# Patient Record
Sex: Male | Born: 1990 | Race: Black or African American | Hispanic: No | Marital: Single | Smoking: Never smoker
Health system: Southern US, Community
[De-identification: ages and names within clinical notes are randomized; demographics above are authoritative.]

---

## 2015-10-08 ENCOUNTER — Emergency Department (HOSPITAL_COMMUNITY)
Admission: EM | Admit: 2015-10-08 | Discharge: 2015-10-08 | Disposition: A | Discharge status: Home / Self Care | Payer: Medicaid Other | Attending: Emergency Medicine | Admitting: Emergency Medicine

## 2015-10-08 ENCOUNTER — Encounter (HOSPITAL_COMMUNITY): Payer: Self-pay | Admitting: Emergency Medicine

## 2015-10-08 DIAGNOSIS — K029 Dental caries, unspecified: Secondary | ICD-10-CM | POA: Diagnosis not present

## 2015-10-08 DIAGNOSIS — K0889 Other specified disorders of teeth and supporting structures: Secondary | ICD-10-CM | POA: Diagnosis present

## 2015-10-08 MED ORDER — PENICILLIN V POTASSIUM 500 MG PO TABS: 500.0000 mg | ORAL_TABLET | Freq: Three times a day (TID) | ORAL | Status: AC

## 2015-10-08 MED ORDER — IBUPROFEN 800 MG PO TABS: 800.0000 mg | ORAL_TABLET | Freq: Three times a day (TID) | ORAL | Status: AC

## 2015-10-08 NOTE — ED Notes (Signed)
Patient states R lower dental pain x 2 days.   Denies decay or broken teeth.   Denies other symptoms.  Patient states has not been to a dentist.

## 2015-10-08 NOTE — Discharge Instructions (Signed)

## 2015-10-08 NOTE — ED Provider Notes (Signed)
CSN: [id number]     Arrival date & time 10/08/15  1217 History  By signing my name below, I, Freida Busman, attest that this documentation has been prepared under the direction and in the presence of non-physician practitioner, Fayrene Helper, PA-C. Electronically Signed: Freida Busman, Scribe. 10/08/2015. 12:38 PM.    Chief Complaint  Patient presents with  . Dental Pain   The history is provided by the patient. No language interpreter was used.    HPI Comments:  Brandon Hatfield is a 25 y.o. male who presents to the Emergency Department complaining of throbbing moderate right lower dental pain x 2 days. His pain is exacerbated when chewing. He denies fever. No alleviating factors noted. Pt does not currently have a dentist.    History reviewed. No pertinent past medical history. History reviewed. No pertinent past surgical history. No family history on file. Social History  Substance Use Topics  . Smoking status: Never Smoker   . Smokeless tobacco: None  . Alcohol Use: No    Review of Systems  Constitutional: Negative for fever.  HENT: Positive for dental problem.       Allergies  Review of patient's allergies indicates no known allergies.  Home Medications   Prior to Admission medications   Not on File   BP 137/88 mmHg  Pulse 62  Temp(Src) 98.4 F (36.9 C) (Oral)  Resp 16  SpO2 100% Physical Exam  Constitutional: He is oriented to person, place, and time. He appears well-developed and well-nourished. No distress.  HENT:  Head: Normocephalic and atraumatic.  Dental decay noted to tooth # 32 with tenderness; no abscess or gingival erythema   Eyes: Conjunctivae are normal.  Cardiovascular: Normal rate.   Pulmonary/Chest: Effort normal.  Abdominal: He exhibits no distension.  Lymphadenopathy:    He has cervical adenopathy.  Neurological: He is alert and oriented to person, place, and time.  Skin: Skin is warm and dry.  Psychiatric: He has a normal mood and  affect.  Nursing note and vitals reviewed.   ED Course  Procedures  DIAGNOSTIC STUDIES:  Oxygen Saturation is 100% on RA, normal by my interpretation.    COORDINATION OF CARE:  12:39 PM Will discharge with antibiotic and dental referral.  Discussed treatment plan with pt at bedside and pt agreed to plan.    MDM   Final diagnoses:  Pain due to dental caries    BP 137/88 mmHg  Pulse 62  Temp(Src) 98.4 F (36.9 C) (Oral)  Resp 16  SpO2 100%  Patient with dentalgia.  No abscess requiring immediate incision and drainage.  Exam not concerning for Ludwig's angina or pharyngeal abscess.  Will treat with PCN and ibuprofen. Pt instructed to follow-up with dentist.  Discussed return precautions. Pt safe for discharge.  I personally performed the services described in this documentation, which was scribed in my presence. The recorded information has been reviewed and is accurate.      Fayrene Helper, PA-C 10/10/15 7829  Vanetta Mulders, MD 10/10/15 2239

## 2016-03-26 ENCOUNTER — Emergency Department (HOSPITAL_COMMUNITY): Payer: Medicaid Other

## 2016-03-26 ENCOUNTER — Encounter (HOSPITAL_COMMUNITY): Payer: Self-pay | Admitting: *Deleted

## 2016-03-26 ENCOUNTER — Emergency Department (HOSPITAL_COMMUNITY)
Admission: EM | Admit: 2016-03-26 | Discharge: 2016-03-26 | Disposition: A | Discharge status: Home / Self Care | Payer: Medicaid Other | Attending: Emergency Medicine | Admitting: Emergency Medicine

## 2016-03-26 DIAGNOSIS — I456 Pre-excitation syndrome: Secondary | ICD-10-CM | POA: Diagnosis not present

## 2016-03-26 DIAGNOSIS — R079 Chest pain, unspecified: Secondary | ICD-10-CM

## 2016-03-26 DIAGNOSIS — R0789 Other chest pain: Secondary | ICD-10-CM | POA: Diagnosis not present

## 2016-03-26 LAB — CBC WITH DIFFERENTIAL/PLATELET
Basophils Absolute: 0 10*3/uL (ref 0.0–0.1)
Basophils Relative: 1 %
EOS ABS: 0.2 10*3/uL (ref 0.0–0.7)
Eosinophils Relative: 6 %
HEMATOCRIT: 43.8 % (ref 39.0–52.0)
HEMOGLOBIN: 14.5 g/dL (ref 13.0–17.0)
LYMPHS ABS: 1.5 10*3/uL (ref 0.7–4.0)
Lymphocytes Relative: 40 %
MCH: 26.4 pg (ref 26.0–34.0)
MCHC: 33.1 g/dL (ref 30.0–36.0)
MCV: 79.8 fL (ref 78.0–100.0)
MONOS PCT: 7 %
Monocytes Absolute: 0.3 10*3/uL (ref 0.1–1.0)
NEUTROS ABS: 1.7 10*3/uL (ref 1.7–7.7)
NEUTROS PCT: 46 %
PLATELETS: 168 10*3/uL (ref 150–400)
RBC: 5.49 MIL/uL (ref 4.22–5.81)
RDW: 13.9 % (ref 11.5–15.5)
WBC: 3.7 10*3/uL — AB (ref 4.0–10.5)

## 2016-03-26 LAB — BASIC METABOLIC PANEL
ANION GAP: 7 (ref 5–15)
BUN: 8 mg/dL (ref 6–20)
CHLORIDE: 105 mmol/L (ref 101–111)
CO2: 26 mmol/L (ref 22–32)
CREATININE: 1.3 mg/dL — AB (ref 0.61–1.24)
Calcium: 9.2 mg/dL (ref 8.9–10.3)
GFR calc non Af Amer: >60 mL/min (ref >60)
Glucose, Bld: 95 mg/dL (ref 65–99)
POTASSIUM: 3.7 mmol/L (ref 3.5–5.1)
SODIUM: 138 mmol/L (ref 135–145)

## 2016-03-26 LAB — I-STAT TROPONIN, ED: TROPONIN I, POC: 0.01 ng/mL (ref 0.00–0.08)

## 2016-03-26 NOTE — ED Notes (Signed)
Pt reports mid chest pain since wed with sob and cough. No resp distress noted at triage, ekg done.

## 2016-03-26 NOTE — ED Provider Notes (Signed)
CSN: [id number]     Arrival date & time 03/26/16  [id number] History   First MD Initiated Contact with Patient 03/26/16 1007     Chief Complaint  Patient presents with  . Chest Pain     (Consider location/radiation/quality/duration/timing/severity/associated sxs/prior Treatment) HPI  25 year old male presents with intermittent chest pain. Pain started 2 days ago. Is a sharp pain that comes and goes. States it last about 2 minutes at a time. Occurred twice 2 days ago and then once last night. Has not had any pain this morning. Has had some cough starting yesterday. Denies sweating, shortness of breath, vomiting, or back/abdominal pain. Typically getting up and moving around makes the pain go away. Has not noticed any exertional pain. Denies dizziness, lightheadedness, syncope, or palpitations.  History reviewed. No pertinent past medical history. History reviewed. No pertinent past surgical history. History reviewed. No pertinent family history. Social History  Substance Use Topics  . Smoking status: Never Smoker   . Smokeless tobacco: None  . Alcohol Use: No    Review of Systems  Constitutional: Negative for diaphoresis.  Respiratory: Positive for cough. Negative for shortness of breath.   Cardiovascular: Positive for chest pain. Negative for palpitations and leg swelling.  Gastrointestinal: Negative for abdominal pain.  Neurological: Negative for dizziness and light-headedness.  All other systems reviewed and are negative.     Allergies  Review of patient's allergies indicates no known allergies.  Home Medications   Prior to Admission medications   Medication Sig Start Date End Date Taking? Authorizing Provider  ibuprofen (ADVIL,MOTRIN) 800 MG tablet Take 1 tablet (800 mg total) by mouth 3 (three) times daily. 10/08/15   Fayrene HelperBowie Tran, PA-C  penicillin v potassium (VEETID) 500 MG tablet Take 1 tablet (500 mg total) by mouth 3 (three) times daily. 10/08/15   Fayrene HelperBowie Tran, PA-C   BP  139/88 mmHg  Pulse 52  Temp(Src) 98.3 F (36.8 C) (Oral)  Resp 18  SpO2 98% Physical Exam  Constitutional: He is oriented to person, place, and time. He appears well-developed and well-nourished. No distress.  HENT:  Head: Normocephalic and atraumatic.  Right Ear: External ear normal.  Left Ear: External ear normal.  Nose: Nose normal.  Eyes: Right eye exhibits no discharge. Left eye exhibits no discharge.  Neck: Neck supple.  Cardiovascular: Regular rhythm, normal heart sounds and intact distal pulses.  Bradycardia present.   Pulmonary/Chest: Effort normal and breath sounds normal. He has no wheezes. He has no rales. He exhibits no tenderness.  Abdominal: Soft. There is no tenderness.  Musculoskeletal: He exhibits no edema or tenderness.  Neurological: He is alert and oriented to person, place, and time.  Skin: Skin is warm and dry. He is not diaphoretic.  Nursing note and vitals reviewed.   ED Course  Procedures (including critical care time) Labs Review Labs Reviewed  BASIC METABOLIC PANEL - Abnormal; Notable for the following:    Creatinine, Ser 1.30 (*)    All other components within normal limits  CBC WITH DIFFERENTIAL/PLATELET - Abnormal; Notable for the following:    WBC 3.7 (*)    All other components within normal limits  I-STAT TROPOININ, ED    Imaging Review Dg Chest 2 View  03/26/2016  CLINICAL DATA:  Chest pain since Wednesday. EXAM: CHEST  2 VIEW COMPARISON:  None. FINDINGS: The heart size and mediastinal contours are within normal limits. Both lungs are clear. The visualized skeletal structures are unremarkable. IMPRESSION: No active cardiopulmonary disease. Electronically Signed  By: Elige Ko   On: 03/26/2016 10:51   I have personally reviewed and evaluated these images and lab results as part of my medical decision-making.   EKG Interpretation   Date/Time:  Friday March 26 2016 09:54:58 EDT Ventricular Rate:  49 PR Interval:  114 QRS Duration:  136 QT Interval:  462 QTC Calculation: 417 R Axis:   -2 Text Interpretation:  Sinus bradycardia Wolff-Parkinson-White Abnormal ECG  No old tracing to compare Confirmed by Joelly Bolanos MD, Asier Desroches [phone]) on  03/26/2016 10:08:01 AM       EKG Interpretation  Date/Time:  Friday March 26 2016 11:27:26 EDT Ventricular Rate:  50 PR Interval:  114 QRS Duration: 148 QT Interval:  468 QTC Calculation: 427 R Axis:   -3 Text Interpretation:  Sinus rhythm Ventricular preexcitation(WPW) no significant since earlier in the day Confirmed by Shaniah Baltes MD, Adael Culbreath [phone]) on 03/26/2016 5:07:24 PM       MDM   Final diagnoses:  Chest pain, unspecified chest pain type  Phillips Odor White pattern seen on electrocardiogram    Patient's chest pain is quite atypical. No chest pain since last night unclear where his chest pain is coming from but he has not had any recently. I highly doubt ACS or PE. ECG shows no acute ST/T changes but does show a WPW pattern. However he has not had any symptoms of excitation such as dizziness, syncope, or lightheadedness. Has not felt palpitations. Discussed importance of following up with cardiology both for his chest pain and the ECG findings. He verbalized understanding. Discussed strict return precautions.    Pricilla Loveless, MD 03/26/16 [phone]

## 2017-07-17 IMAGING — DX DG CHEST 2V
2 series · 2 of 2 positions shown · non-contrast
Comparison: None.

CLINICAL DATA: Chest pain since [REDACTED].

EXAM:
CHEST  2 VIEW

[w chest pa]
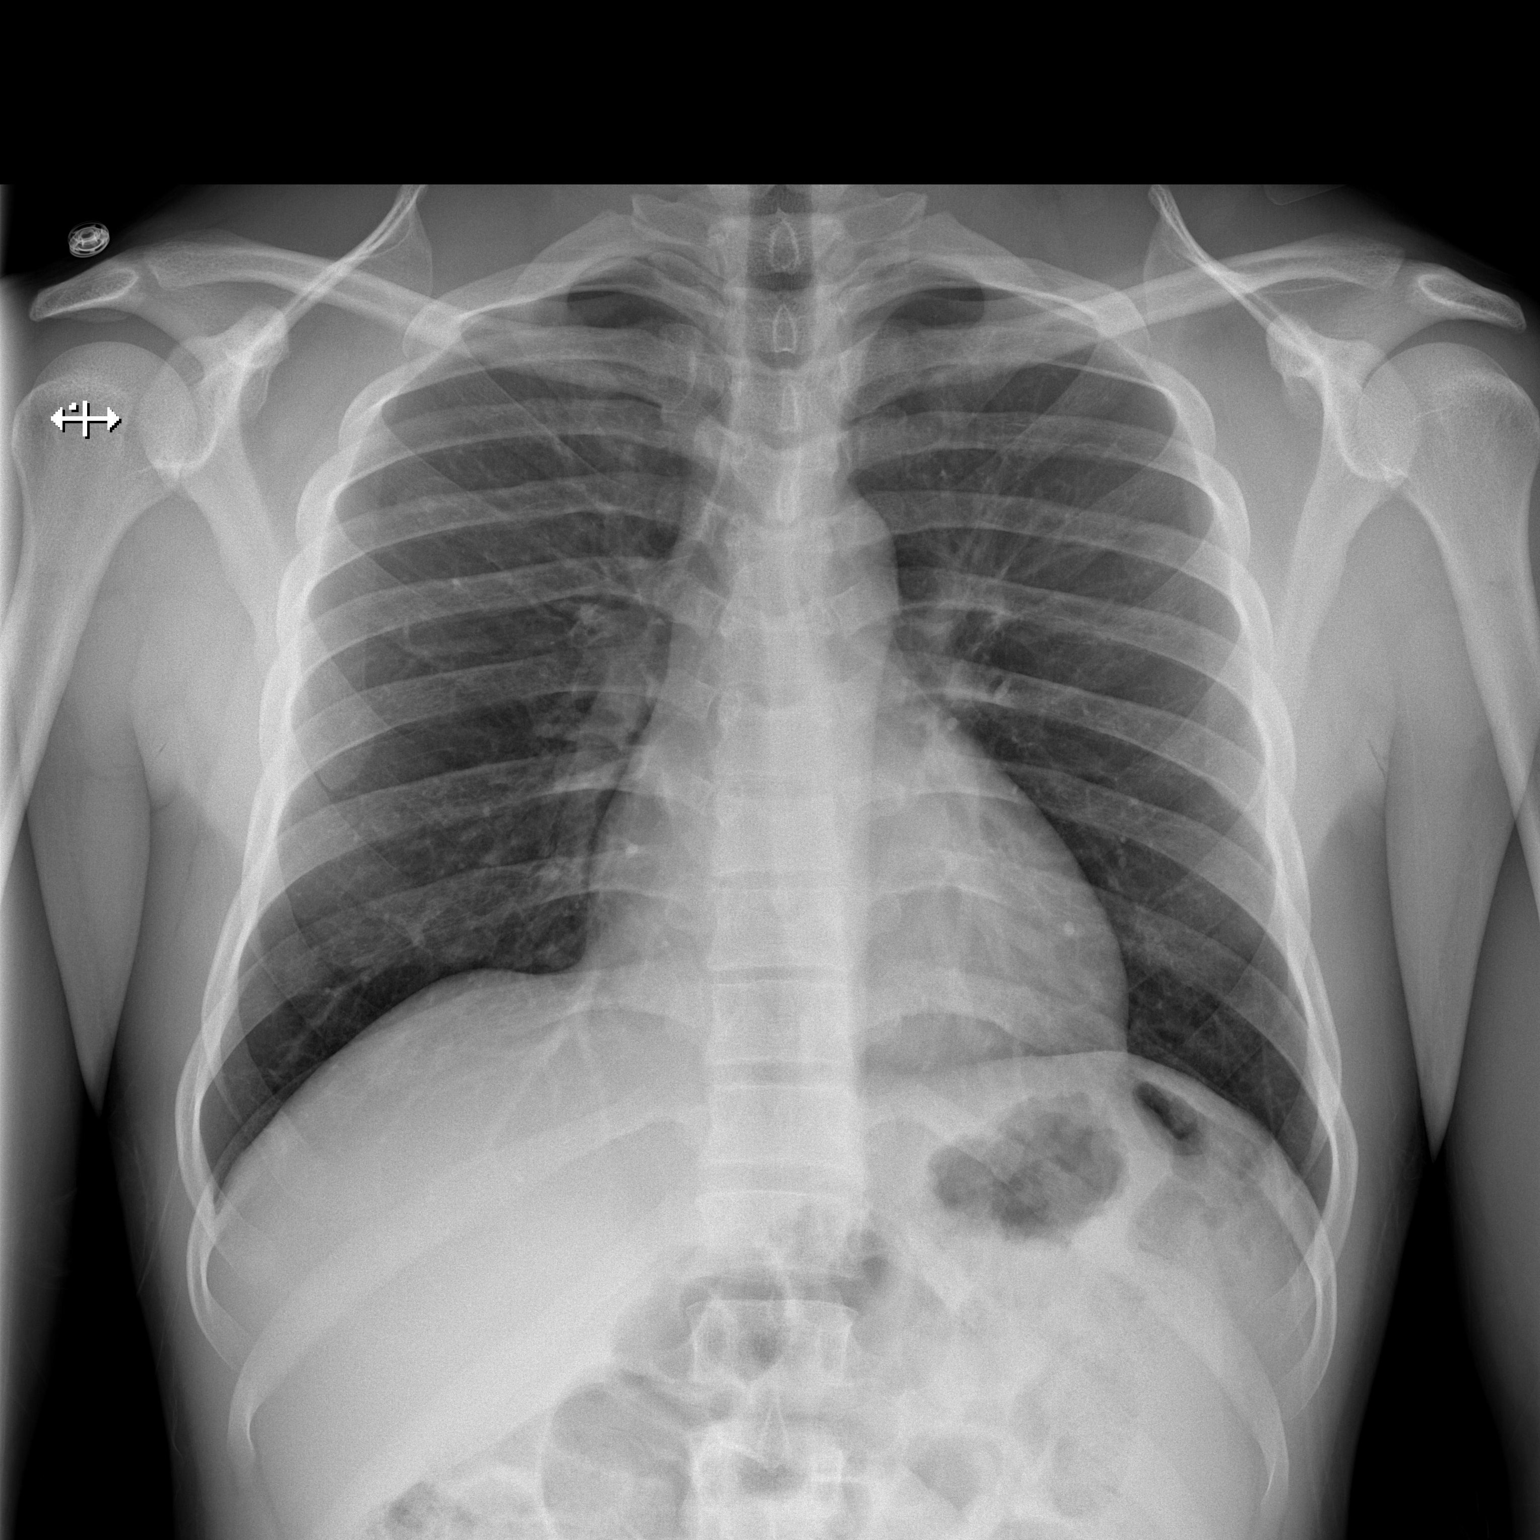

[w chest lat]
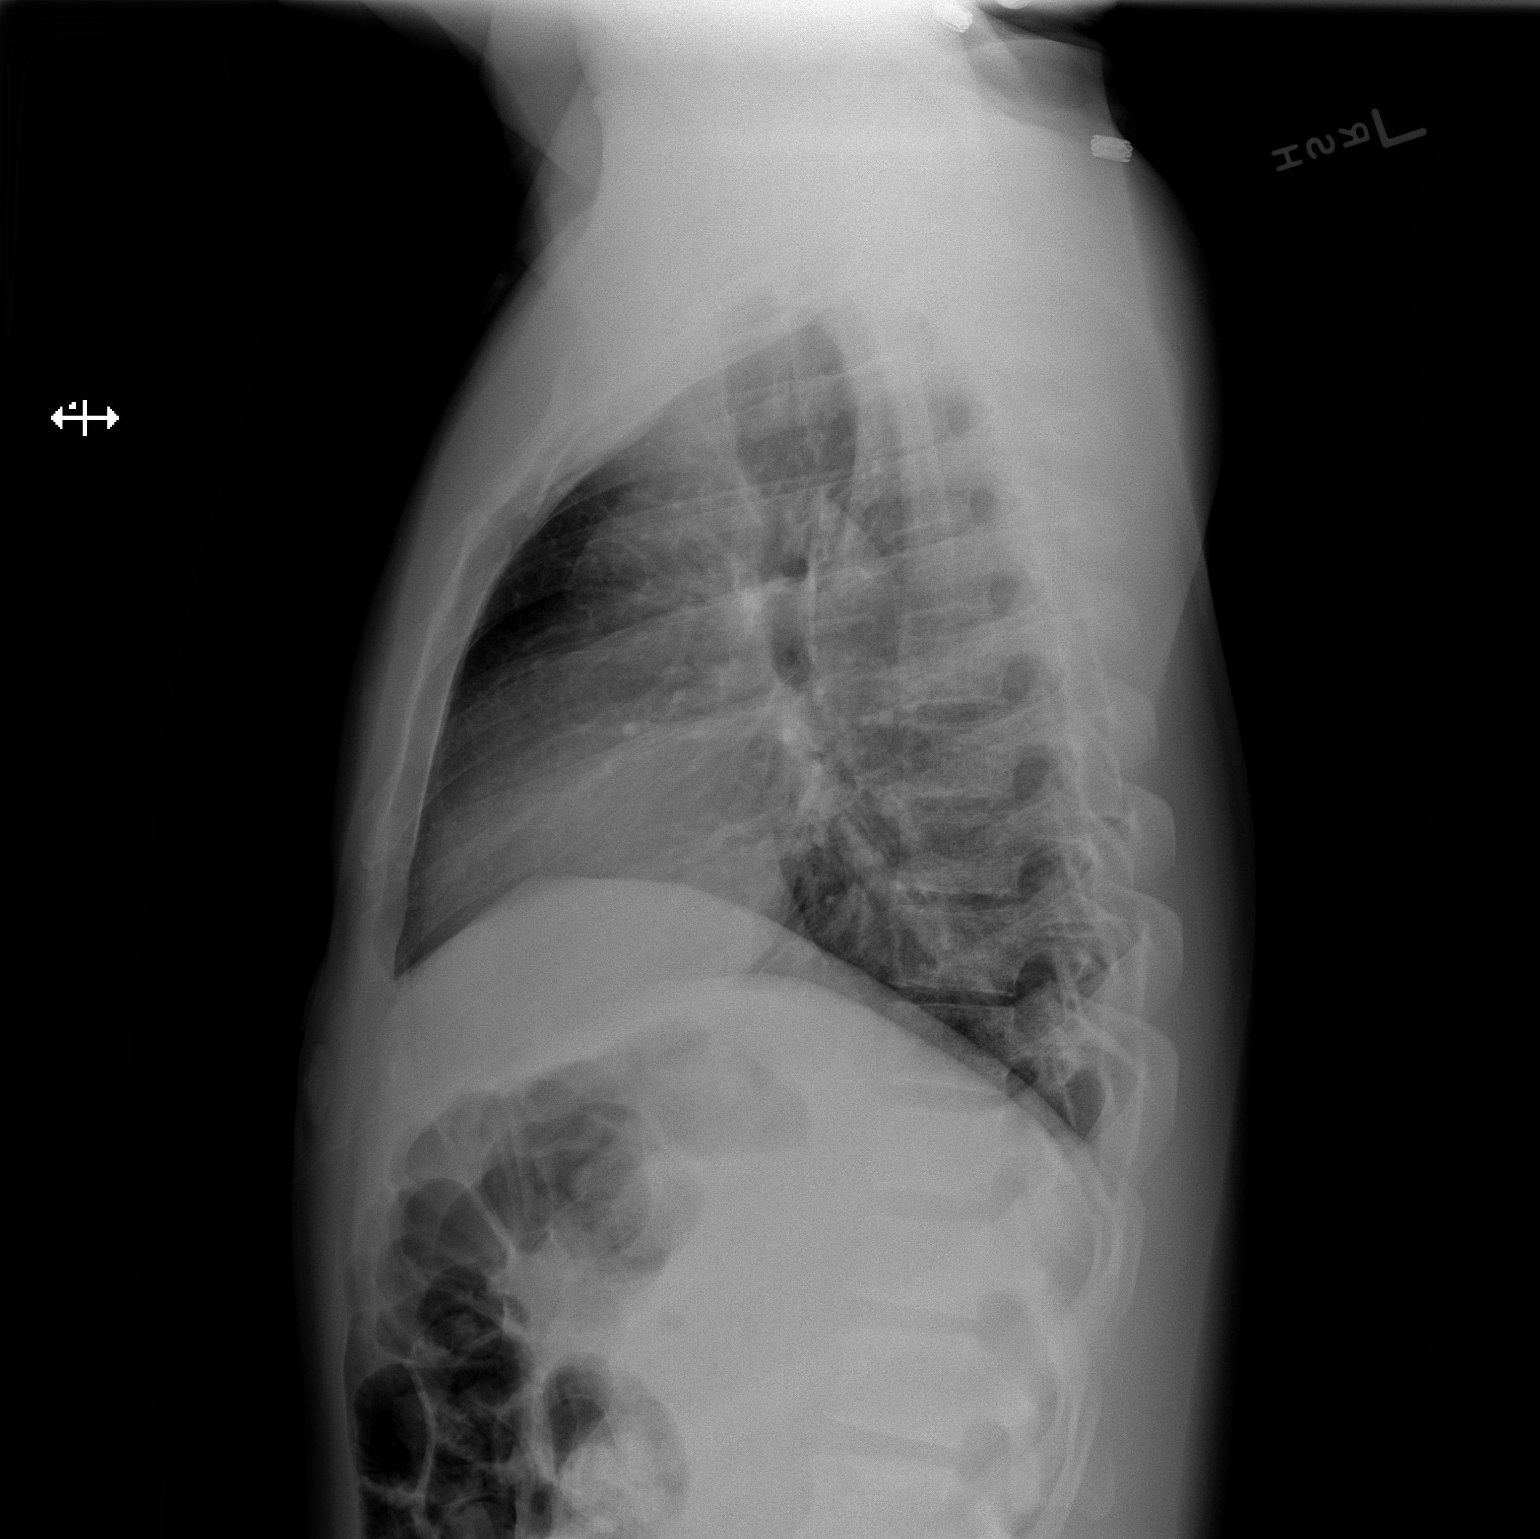

[2 of 2 positions shown; findings below may reference images not displayed]

FINDINGS: The heart size and mediastinal contours are within normal limits.
Both lungs are clear. The visualized skeletal structures are
unremarkable.
IMPRESSION: No active cardiopulmonary disease.
